# Patient Record
Sex: Male | Born: 1975 | Race: Black or African American | Hispanic: No | Marital: Single | Smoking: Current every day smoker
Health system: Southern US, Community
[De-identification: ages and names within clinical notes are randomized; demographics above are authoritative.]

---

## 2002-02-19 ENCOUNTER — Emergency Department (HOSPITAL_COMMUNITY): Admission: EM | Admit: 2002-02-19 | Discharge: 2002-02-19 | Payer: Self-pay | Admitting: *Deleted

## 2003-10-03 ENCOUNTER — Emergency Department (HOSPITAL_COMMUNITY): Admission: EM | Admit: 2003-10-03 | Discharge: 2003-10-03 | Payer: Self-pay | Admitting: Emergency Medicine

## 2004-01-18 ENCOUNTER — Emergency Department (HOSPITAL_COMMUNITY): Admission: EM | Admit: 2004-01-18 | Discharge: 2004-01-18 | Payer: Self-pay | Admitting: Emergency Medicine

## 2004-04-18 ENCOUNTER — Emergency Department (HOSPITAL_COMMUNITY): Admission: EM | Admit: 2004-04-18 | Discharge: 2004-04-18 | Payer: Self-pay | Admitting: Emergency Medicine

## 2004-09-07 ENCOUNTER — Emergency Department (HOSPITAL_COMMUNITY): Admission: EM | Admit: 2004-09-07 | Discharge: 2004-09-07 | Payer: Self-pay | Admitting: Emergency Medicine

## 2004-09-10 ENCOUNTER — Emergency Department (HOSPITAL_COMMUNITY): Admission: EM | Admit: 2004-09-10 | Discharge: 2004-09-10 | Payer: Self-pay | Admitting: Emergency Medicine

## 2005-12-11 ENCOUNTER — Emergency Department (HOSPITAL_COMMUNITY): Admission: EM | Admit: 2005-12-11 | Discharge: 2005-12-11 | Payer: Self-pay | Admitting: Emergency Medicine

## 2006-04-29 ENCOUNTER — Emergency Department (HOSPITAL_COMMUNITY): Admission: EM | Admit: 2006-04-29 | Discharge: 2006-04-29 | Payer: Self-pay | Admitting: Emergency Medicine

## 2006-09-30 ENCOUNTER — Emergency Department (HOSPITAL_COMMUNITY): Admission: EM | Admit: 2006-09-30 | Discharge: 2006-09-30 | Payer: Self-pay | Admitting: Emergency Medicine

## 2006-12-13 ENCOUNTER — Emergency Department (HOSPITAL_COMMUNITY): Admission: EM | Admit: 2006-12-13 | Discharge: 2006-12-13 | Payer: Self-pay | Admitting: Emergency Medicine

## 2007-06-21 ENCOUNTER — Emergency Department (HOSPITAL_COMMUNITY): Admission: EM | Admit: 2007-06-21 | Discharge: 2007-06-21 | Payer: Self-pay | Admitting: Emergency Medicine

## 2007-09-20 ENCOUNTER — Emergency Department (HOSPITAL_COMMUNITY): Admission: EM | Admit: 2007-09-20 | Discharge: 2007-09-20 | Payer: Self-pay | Admitting: Emergency Medicine

## 2008-08-05 ENCOUNTER — Emergency Department (HOSPITAL_COMMUNITY): Admission: EM | Admit: 2008-08-05 | Discharge: 2008-08-06 | Payer: Self-pay | Admitting: Emergency Medicine

## 2010-04-06 ENCOUNTER — Emergency Department (HOSPITAL_COMMUNITY): Admission: EM | Admit: 2010-04-06 | Discharge: 2010-04-06 | Payer: Self-pay | Admitting: Emergency Medicine

## 2010-11-27 ENCOUNTER — Emergency Department (HOSPITAL_COMMUNITY)
Admission: EM | Admit: 2010-11-27 | Discharge: 2010-11-27 | Disposition: A | Discharge status: Home / Self Care | Payer: Self-pay | Attending: Emergency Medicine | Admitting: Emergency Medicine

## 2010-11-27 DIAGNOSIS — L989 Disorder of the skin and subcutaneous tissue, unspecified: Secondary | ICD-10-CM | POA: Insufficient documentation

## 2010-11-27 DIAGNOSIS — J3489 Other specified disorders of nose and nasal sinuses: Secondary | ICD-10-CM | POA: Insufficient documentation

## 2010-11-27 DIAGNOSIS — R509 Fever, unspecified: Secondary | ICD-10-CM | POA: Insufficient documentation

## 2010-11-27 DIAGNOSIS — B9789 Other viral agents as the cause of diseases classified elsewhere: Secondary | ICD-10-CM | POA: Insufficient documentation

## 2010-11-27 DIAGNOSIS — IMO0001 Reserved for inherently not codable concepts without codable children: Secondary | ICD-10-CM | POA: Insufficient documentation

## 2010-11-27 DIAGNOSIS — R6889 Other general symptoms and signs: Secondary | ICD-10-CM | POA: Insufficient documentation

## 2010-11-27 DIAGNOSIS — R5381 Other malaise: Secondary | ICD-10-CM | POA: Insufficient documentation

## 2010-11-27 DIAGNOSIS — J45909 Unspecified asthma, uncomplicated: Secondary | ICD-10-CM | POA: Insufficient documentation

## 2011-04-02 ENCOUNTER — Emergency Department (HOSPITAL_COMMUNITY)
Admission: EM | Admit: 2011-04-02 | Discharge: 2011-04-02 | Disposition: A | Discharge status: Home / Self Care | Payer: Self-pay | Attending: Emergency Medicine | Admitting: Emergency Medicine

## 2011-04-02 DIAGNOSIS — R0602 Shortness of breath: Secondary | ICD-10-CM | POA: Insufficient documentation

## 2011-04-02 DIAGNOSIS — J45909 Unspecified asthma, uncomplicated: Secondary | ICD-10-CM | POA: Insufficient documentation

## 2011-04-02 DIAGNOSIS — F1010 Alcohol abuse, uncomplicated: Secondary | ICD-10-CM | POA: Insufficient documentation

## 2011-04-02 DIAGNOSIS — R112 Nausea with vomiting, unspecified: Secondary | ICD-10-CM | POA: Insufficient documentation

## 2011-07-16 LAB — RAPID STREP SCREEN (MED CTR MEBANE ONLY): Streptococcus, Group A Screen (Direct): NEGATIVE

## 2012-01-31 ENCOUNTER — Emergency Department (HOSPITAL_COMMUNITY)
Admission: EM | Admit: 2012-01-31 | Discharge: 2012-01-31 | Disposition: A | Discharge status: Home / Self Care | Payer: Self-pay | Attending: Emergency Medicine | Admitting: Emergency Medicine

## 2012-01-31 ENCOUNTER — Encounter (HOSPITAL_COMMUNITY): Payer: Self-pay | Admitting: Emergency Medicine

## 2012-01-31 DIAGNOSIS — H6690 Otitis media, unspecified, unspecified ear: Secondary | ICD-10-CM | POA: Insufficient documentation

## 2012-01-31 DIAGNOSIS — H9209 Otalgia, unspecified ear: Secondary | ICD-10-CM | POA: Insufficient documentation

## 2012-01-31 DIAGNOSIS — H9201 Otalgia, right ear: Secondary | ICD-10-CM

## 2012-01-31 MED ORDER — AMOXICILLIN 500 MG PO CAPS: 500.0000 mg | ORAL_CAPSULE | Freq: Three times a day (TID) | ORAL | Status: AC

## 2012-01-31 NOTE — Discharge Instructions (Signed)
RESOURCE GUIDE  Dental Problems  Patients with Medicaid: [facility]                     [facility] [address]                                           [address] Phone:  [phone]                                                  Phone:  [phone]  If unable to pay or uninsured, contact:  [facility] or [facility] to become qualified for the adult dental clinic.  Chronic Pain Problems Contact [facility]  [phone] Patients need to be referred by their primary care doctor.  Insufficient Money for Medicine Contact [employer]:  call "211" or [facility] 271-5999.  No Primary Care Doctor Call [facility]  832-8000 Other agencies that provide inexpensive medical care    [facility]  [phone]    [facility]  [phone]    [facility]  [phone]    [facility]  [phone]    [facility]  [phone]    [facility]  [phone]  Psychological Services [facility]  [phone] [facility]  [phone] [facility]   800 853-5163 (emergency services 641-4993)  Substance Abuse Resources [facility]  336-882-2125 [facility] 336-784-9470 [facility] 336-285-9073 [facility] 336-845-3988 Residential & Outpatient Substance Abuse Program  800-659-3381  Abuse/Neglect [place] Child Abuse Hotline (336) 641-3795 [place] Child Abuse Hotline 800-378-5315 (After Hours)  Emergency Shelter [facility] (336) 271-5985  Maternity Homes [facility] (336) 275-9566 [facility] (704) 372-4663  MRSA Hotline #:   832-7006    [place] [facility]  [facility] [place] [facility]     [employer]                          [facility] [address] [place]                       [address]      [address]  [place]                                                [place]                            [place] Phone:  [phone]                                   Phone:  [phone]                 Phone:  [phone]  [facility] Phone:  [phone]    [facility] Child Abuse Hotline 410-673-0136 857-164-4084 (After Hours)   Take the prescription as directed.  Take over the counter sudafed, as directed on packaging, for the next week.  Use over the counter normal saline nasal spray, as instructed in the Emergency Department, several times per day for the next 2 weeks.  Call your regular medical doctor on Monday to schedule a follow up appointment within the next week.  Return to the Emergency Department immediately if worsening.

## 2012-01-31 NOTE — ED Provider Notes (Signed)
History     CSN: [id number]  Arrival date & time 01/31/12  1549   First MD Initiated Contact with Patient 01/31/12 1703      Chief Complaint  Patient presents with  . Otalgia    HPI Pt was seen at 1715.  Per pt, c/o gradual onset and persistence of constant right ear "pain" for the past 3 days.  Pt states he recently had URI symptoms, but they have resolved.  Denies fevers, no rash, no sore throat, no ear injury, no cough/SOB.      Past Medical History  Diagnosis Date  . Asthma     History reviewed. No pertinent past surgical history.   History  Substance Use Topics  . Smoking status: Former Smoker    Quit date: 01/17/2012  . Smokeless tobacco: Not on file  . Alcohol Use: No    Review of Systems ROS: Statement: All systems negative except as marked or noted in the HPI; Constitutional: Negative for fever and chills. ; ; Eyes: Negative for eye pain, redness and discharge. ; ; ENMT: +right ear pain. Negative for hoarseness, nasal congestion, sinus pressure and sore throat. ; ; Cardiovascular: Negative for chest pain, palpitations, diaphoresis, dyspnea and peripheral edema. ; ; Respiratory: Negative for cough, wheezing and stridor. ; ; Gastrointestinal: Negative for nausea, vomiting, diarrhea, abdominal pain, blood in stool, hematemesis, jaundice and rectal bleeding. . ; ; Genitourinary: Negative for dysuria, flank pain and hematuria. ; ; Musculoskeletal: Negative for back pain and neck pain. Negative for swelling and trauma.; ; Skin: Negative for pruritus, rash, abrasions, blisters, bruising and skin lesion.; ; Neuro: Negative for headache, lightheadedness and neck stiffness. Negative for weakness, altered level of consciousness , altered mental status, extremity weakness, paresthesias, involuntary movement, seizure and syncope.     Allergies  Aspirin  Home Medications   Current Outpatient Rx  Name Route Sig Dispense Refill  . VICKS DAYQUIL COUGH PO Oral Take 1-2 capsules by  mouth daily as needed. For cold symptoms    . SUDAFED PO Oral Take 1 tablet by mouth 4 (four) times daily as needed. For cold symptoms      BP 160/97  Pulse 101  Temp(Src) 98.6 F (37 C) (Oral)  Resp 16  SpO2 100%  Physical Exam 1720: Physical examination:  Nursing notes reviewed; Vital signs and O2 SAT reviewed;  Constitutional: Well developed, Well nourished, Well hydrated, In no acute distress; Head:  Normocephalic, atraumatic; Eyes: EOMI, PERRL, No scleral icterus; ENMT: +purulent fluid behind right TM. No FB in canal.  Left TM clear.  +edemetous nasal turbinates bilat with clear rhinorrhea. Mouth and pharynx normal, Mucous membranes moist; Neck: Supple, Full range of motion, No lymphadenopathy; Cardiovascular: Regular rate and rhythm, No murmur or gallop; Respiratory: Breath sounds clear & equal bilaterally, No rales, rhonchi, wheezes, or rub, Normal respiratory effort/excursion; Chest: Nontender, Movement normal; Extremities: Pulses normal, No tenderness, No edema, No calf edema or asymmetry.; Neuro: AA&Ox3, Major CN grossly intact.  No gross focal motor or sensory deficits in extremities.; Skin: Color normal, Warm, Dry, no rash.    ED Course  Procedures   MDM  MDM Reviewed: nursing note and vitals    1730:  Recent URI since improved but now worsening right ear pain.  Appears AOM, will tx.            Laray Anger, DO 01/31/12 2257

## 2012-01-31 NOTE — ED Notes (Addendum)
C/o R earache x 3 days.  States he has recently had cold symptoms and several ticks on him. Unable to hear out of R ear today.

## 2012-07-02 ENCOUNTER — Encounter (HOSPITAL_COMMUNITY): Payer: Self-pay | Admitting: *Deleted

## 2012-07-02 ENCOUNTER — Emergency Department (HOSPITAL_COMMUNITY)
Admission: EM | Admit: 2012-07-02 | Discharge: 2012-07-02 | Disposition: A | Discharge status: Home / Self Care | Payer: Self-pay | Attending: Emergency Medicine | Admitting: Emergency Medicine

## 2012-07-02 DIAGNOSIS — R05 Cough: Secondary | ICD-10-CM | POA: Insufficient documentation

## 2012-07-02 DIAGNOSIS — R51 Headache: Secondary | ICD-10-CM | POA: Insufficient documentation

## 2012-07-02 DIAGNOSIS — H9209 Otalgia, unspecified ear: Secondary | ICD-10-CM | POA: Insufficient documentation

## 2012-07-02 DIAGNOSIS — R059 Cough, unspecified: Secondary | ICD-10-CM | POA: Insufficient documentation

## 2012-07-02 DIAGNOSIS — J45909 Unspecified asthma, uncomplicated: Secondary | ICD-10-CM | POA: Insufficient documentation

## 2012-07-02 DIAGNOSIS — J029 Acute pharyngitis, unspecified: Secondary | ICD-10-CM | POA: Insufficient documentation

## 2012-07-02 DIAGNOSIS — R599 Enlarged lymph nodes, unspecified: Secondary | ICD-10-CM | POA: Insufficient documentation

## 2012-07-02 DIAGNOSIS — R509 Fever, unspecified: Secondary | ICD-10-CM | POA: Insufficient documentation

## 2012-07-02 LAB — RAPID STREP SCREEN (MED CTR MEBANE ONLY): Streptococcus, Group A Screen (Direct): NEGATIVE

## 2012-07-02 MED ORDER — OXYCODONE-ACETAMINOPHEN 5-325 MG PO TABS: 1.0000 | ORAL_TABLET | Freq: Four times a day (QID) | ORAL | Status: DC | PRN

## 2012-07-02 MED ORDER — PENICILLIN V POTASSIUM 500 MG PO TABS: 500.0000 mg | ORAL_TABLET | Freq: Four times a day (QID) | ORAL | Status: AC

## 2012-07-02 NOTE — ED Notes (Signed)
The pt has had respiratory problems for 3 days.  Lt earache sorethroat headache  Non-productive cough

## 2012-07-02 NOTE — ED Provider Notes (Signed)
History  Scribed for [employer]. Clara Smolen, MD, the patient was seen in room TR10C/TR10C. This chart was scribed by Candelaria Stagers. The patient's care started at 5:19 PM   CSN: [id number]  Arrival date & time 07/02/12  1627   First MD Initiated Contact with Patient 07/02/12 1704      Chief Complaint  Patient presents with  . upper resp symptoms      The history is provided by the patient. No language interpreter was used.   Anthony Patterson is a 36 y.o. male who presents to the Emergency Department complaining of sore throat, left earache, and non productive cough that started three days ago.  Pt has also experienced headache and fever.  Nothing seems to make the sx better or worse.  Pt smokes.      Past Medical History  Diagnosis Date  . Asthma     History reviewed. No pertinent past surgical history.  No family history on file.  History  Substance Use Topics  . Smoking status: Former Smoker    Quit date: 01/17/2012  . Smokeless tobacco: Not on file  . Alcohol Use: No      Review of Systems  Constitutional: Positive for fever.  HENT: Positive for ear pain (left ear) and sore throat.   Respiratory: Positive for cough.   Gastrointestinal: Negative for nausea, vomiting and anal bleeding.  Neurological: Positive for headaches.  All other systems reviewed and are negative.    Allergies  Aspirin  Home Medications   Current Outpatient Rx  Name Route Sig Dispense Refill  . TYLENOL PO Oral Take 1 tablet by mouth once.    Marland Kitchen PHENYLEPHRINE HCL 10 MG PO TABS Oral Take 10 mg by mouth every 4 (four) hours as needed. For nasal congestion    . DAYQUIL PO Oral Take 10 mLs by mouth once.    . OXYCODONE-ACETAMINOPHEN 5-325 MG PO TABS Oral Take 1-2 tablets by mouth every 6 (six) hours as needed for pain. 10 tablet 0  . PENICILLIN V POTASSIUM 500 MG PO TABS Oral Take 1 tablet (500 mg total) by mouth 4 (four) times daily. 40 tablet 0    BP 141/74  Pulse 75  Temp 98 F (36.7  C) (Oral)  Resp 18  SpO2 97%  Physical Exam  Nursing note and vitals reviewed. Constitutional: He is oriented to person, place, and time. He appears well-developed and well-nourished. No distress.  HENT:  Head: Normocephalic and atraumatic.       Mild erythema to left TM.  Left postier pharynx mild erythema.  No peritonsilar swelling  Eyes: EOM are normal. Pupils are equal, round, and reactive to light.  Neck: Neck supple. No tracheal deviation present.  Pulmonary/Chest: Effort normal. No respiratory distress.  Musculoskeletal: Normal range of motion. He exhibits no edema.  Lymphadenopathy:    He has cervical adenopathy.  Neurological: He is alert and oriented to person, place, and time.  Skin: Skin is warm and dry.  Psychiatric: He has a normal mood and affect. His behavior is normal.    ED Course  Procedures   DIAGNOSTIC STUDIES: Oxygen Saturation is 97% on room air, normal by my interpretation.    COORDINATION OF CARE:  17:16 Ordered: Rapid strep screen     Labs Reviewed  RAPID STREP SCREEN   No results found.   1. Pharyngitis       MDM  Patient with sore throat left-sided earache. Nonproductive cough. He has some exudate to be treated  as possible streptococcal pharyngitis. He'll be given antibiotics and pain medications and discharged home. No signs of abscesses.   I personally performed the services described in this documentation, which was scribed in my presence. The recorded information has been reviewed and considered.        Juliet Rude. Rubin Payor, MD 07/02/12 1729

## 2012-08-07 ENCOUNTER — Encounter (HOSPITAL_COMMUNITY): Payer: Self-pay | Admitting: Emergency Medicine

## 2012-08-07 ENCOUNTER — Emergency Department (HOSPITAL_COMMUNITY)
Admission: EM | Admit: 2012-08-07 | Discharge: 2012-08-07 | Disposition: A | Discharge status: Home / Self Care | Payer: No Typology Code available for payment source | Attending: Emergency Medicine | Admitting: Emergency Medicine

## 2012-08-07 ENCOUNTER — Emergency Department (HOSPITAL_COMMUNITY): Payer: No Typology Code available for payment source

## 2012-08-07 DIAGNOSIS — R51 Headache: Secondary | ICD-10-CM | POA: Insufficient documentation

## 2012-08-07 DIAGNOSIS — Y9389 Activity, other specified: Secondary | ICD-10-CM | POA: Insufficient documentation

## 2012-08-07 DIAGNOSIS — Z87891 Personal history of nicotine dependence: Secondary | ICD-10-CM | POA: Insufficient documentation

## 2012-08-07 DIAGNOSIS — S39012A Strain of muscle, fascia and tendon of lower back, initial encounter: Secondary | ICD-10-CM

## 2012-08-07 DIAGNOSIS — S335XXA Sprain of ligaments of lumbar spine, initial encounter: Secondary | ICD-10-CM | POA: Insufficient documentation

## 2012-08-07 DIAGNOSIS — S161XXA Strain of muscle, fascia and tendon at neck level, initial encounter: Secondary | ICD-10-CM

## 2012-08-07 DIAGNOSIS — S139XXA Sprain of joints and ligaments of unspecified parts of neck, initial encounter: Secondary | ICD-10-CM | POA: Insufficient documentation

## 2012-08-07 DIAGNOSIS — J45909 Unspecified asthma, uncomplicated: Secondary | ICD-10-CM | POA: Insufficient documentation

## 2012-08-07 DIAGNOSIS — Y9289 Other specified places as the place of occurrence of the external cause: Secondary | ICD-10-CM | POA: Insufficient documentation

## 2012-08-07 MED ORDER — CYCLOBENZAPRINE HCL 10 MG PO TABS: 10.0000 mg | ORAL_TABLET | Freq: Three times a day (TID) | ORAL | Status: DC | PRN

## 2012-08-07 MED ORDER — OXYCODONE-ACETAMINOPHEN 5-325 MG PO TABS: 1.0000 | ORAL_TABLET | Freq: Four times a day (QID) | ORAL | Status: DC | PRN

## 2012-08-07 NOTE — ED Notes (Signed)
Patient transported to and from X-ray 
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
## 2012-08-07 NOTE — ED Notes (Signed)
Patient transported to X-ray 
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
## 2012-09-21 ENCOUNTER — Encounter (HOSPITAL_COMMUNITY): Payer: Self-pay | Admitting: Emergency Medicine

## 2012-09-21 ENCOUNTER — Emergency Department (HOSPITAL_COMMUNITY): Payer: Self-pay

## 2012-09-21 ENCOUNTER — Emergency Department (HOSPITAL_COMMUNITY)
Admission: EM | Admit: 2012-09-21 | Discharge: 2012-09-21 | Disposition: A | Discharge status: Home / Self Care | Payer: Self-pay | Attending: Emergency Medicine | Admitting: Emergency Medicine

## 2012-09-21 DIAGNOSIS — W230XXA Caught, crushed, jammed, or pinched between moving objects, initial encounter: Secondary | ICD-10-CM | POA: Insufficient documentation

## 2012-09-21 DIAGNOSIS — J45909 Unspecified asthma, uncomplicated: Secondary | ICD-10-CM | POA: Insufficient documentation

## 2012-09-21 DIAGNOSIS — Y9389 Activity, other specified: Secondary | ICD-10-CM | POA: Insufficient documentation

## 2012-09-21 DIAGNOSIS — Z87891 Personal history of nicotine dependence: Secondary | ICD-10-CM | POA: Insufficient documentation

## 2012-09-21 DIAGNOSIS — T148XXA Other injury of unspecified body region, initial encounter: Secondary | ICD-10-CM

## 2012-09-21 DIAGNOSIS — S8000XA Contusion of unspecified knee, initial encounter: Secondary | ICD-10-CM | POA: Insufficient documentation

## 2012-09-21 DIAGNOSIS — Y929 Unspecified place or not applicable: Secondary | ICD-10-CM | POA: Insufficient documentation

## 2012-09-21 DIAGNOSIS — S8990XA Unspecified injury of unspecified lower leg, initial encounter: Secondary | ICD-10-CM | POA: Insufficient documentation

## 2012-09-21 MED ORDER — IBUPROFEN 400 MG PO TABS
600.0000 mg | ORAL_TABLET | Freq: Once | ORAL | Status: AC
  Administered 2012-09-21: 600 mg via ORAL
  Filled 2012-09-21: qty 1

## 2012-09-21 MED ORDER — IBUPROFEN 600 MG PO TABS: 600.0000 mg | ORAL_TABLET | Freq: Four times a day (QID) | ORAL | Status: DC | PRN

## 2012-09-21 MED ORDER — HYDROCODONE-ACETAMINOPHEN 5-325 MG PO TABS: 1.0000 | ORAL_TABLET | Freq: Four times a day (QID) | ORAL | Status: DC | PRN

## 2012-09-21 MED ORDER — HYDROCODONE-ACETAMINOPHEN 5-325 MG PO TABS
2.0000 | ORAL_TABLET | Freq: Once | ORAL | Status: AC
  Administered 2012-09-21: 2 via ORAL
  Filled 2012-09-21: qty 2
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
## 2012-09-21 NOTE — ED Notes (Signed)
Ortho at bedside.

## 2012-09-21 NOTE — Progress Notes (Signed)
Orthopedic Tech Progress Note Patient Details:  Elimelech Houseman [facility] 1976/08/31 [id number]  Ortho Devices Type of Ortho Device: Knee Immobilizer Ortho Device/Splint Location: (L) LE Ortho Device/Splint Interventions: Application   Jennye Moccasin 09/21/2012, 8:17 PM

## 2012-09-21 NOTE — ED Notes (Signed)
Pt transported to XR.  
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
## 2012-10-12 ENCOUNTER — Emergency Department (HOSPITAL_COMMUNITY): Payer: No Typology Code available for payment source

## 2012-10-12 ENCOUNTER — Encounter (HOSPITAL_COMMUNITY): Payer: Self-pay | Admitting: Cardiology

## 2012-10-12 ENCOUNTER — Emergency Department (HOSPITAL_COMMUNITY)
Admission: EM | Admit: 2012-10-12 | Discharge: 2012-10-12 | Disposition: A | Discharge status: Home / Self Care | Payer: No Typology Code available for payment source | Attending: Emergency Medicine | Admitting: Emergency Medicine

## 2012-10-12 DIAGNOSIS — Z79899 Other long term (current) drug therapy: Secondary | ICD-10-CM | POA: Insufficient documentation

## 2012-10-12 DIAGNOSIS — R079 Chest pain, unspecified: Secondary | ICD-10-CM | POA: Insufficient documentation

## 2012-10-12 DIAGNOSIS — I10 Essential (primary) hypertension: Secondary | ICD-10-CM | POA: Insufficient documentation

## 2012-10-12 DIAGNOSIS — M79609 Pain in unspecified limb: Secondary | ICD-10-CM | POA: Insufficient documentation

## 2012-10-12 DIAGNOSIS — R0602 Shortness of breath: Secondary | ICD-10-CM | POA: Insufficient documentation

## 2012-10-12 DIAGNOSIS — J45909 Unspecified asthma, uncomplicated: Secondary | ICD-10-CM | POA: Insufficient documentation

## 2012-10-12 DIAGNOSIS — F17200 Nicotine dependence, unspecified, uncomplicated: Secondary | ICD-10-CM | POA: Insufficient documentation

## 2012-10-12 LAB — POCT I-STAT, CHEM 8
Creatinine, Ser: 0.8 mg/dL (ref 0.50–1.35)
Hemoglobin: 15 g/dL (ref 13.0–17.0)
Sodium: 140 mEq/L (ref 135–145)
TCO2: 28 mmol/L (ref 0–100)

## 2012-10-12 NOTE — ED Notes (Signed)
Pt here today because for the past 2-3 days, he has had throbbing in his head and left arm tingling. Went to [employer] to check his BP, got anxious and had a short episode of chest "tightness". No CP at this time.
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
## 2012-10-12 NOTE — ED Notes (Signed)
Pt ambulated to restroom. 

## 2012-10-12 NOTE — ED Notes (Signed)
Pt states that for the last several weeks he has had intermittent L leg and L arm tingling/pain.  He also describes a severe HA in his bilateral temple area.  Denies Nausea or vomiting.  Skin warm and dry.  Resp symmetrical and unlabored.  States that he does not have Chest pain at the present.

## 2012-10-12 NOTE — ED Notes (Signed)
Report given to [place]. Pt moved to Pod C

## 2012-10-12 NOTE — ED Notes (Signed)
Pt given discharge paperwork with a list of resources; pt verbalized understanding of discharge; pt alert and oriented x4;

## 2012-10-12 NOTE — ED Notes (Signed)
Pt to XRAY
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
## 2012-10-13 NOTE — ED Provider Notes (Signed)
Medical screening examination/treatment/procedure(s) were performed by non-physician practitioner and as supervising physician I was immediately available for consultation/collaboration.   Gwyneth Sprout, MD 10/13/12 [phone]

## 2013-06-09 ENCOUNTER — Encounter (HOSPITAL_COMMUNITY): Payer: Self-pay | Admitting: *Deleted

## 2013-06-09 ENCOUNTER — Emergency Department (HOSPITAL_COMMUNITY)
Admission: EM | Admit: 2013-06-09 | Discharge: 2013-06-09 | Disposition: A | Discharge status: Home / Self Care | Payer: Self-pay | Attending: Emergency Medicine | Admitting: Emergency Medicine

## 2013-06-09 ENCOUNTER — Emergency Department (HOSPITAL_COMMUNITY): Payer: Self-pay

## 2013-06-09 DIAGNOSIS — J3489 Other specified disorders of nose and nasal sinuses: Secondary | ICD-10-CM | POA: Insufficient documentation

## 2013-06-09 DIAGNOSIS — H9209 Otalgia, unspecified ear: Secondary | ICD-10-CM | POA: Insufficient documentation

## 2013-06-09 DIAGNOSIS — F17200 Nicotine dependence, unspecified, uncomplicated: Secondary | ICD-10-CM | POA: Insufficient documentation

## 2013-06-09 DIAGNOSIS — J020 Streptococcal pharyngitis: Secondary | ICD-10-CM | POA: Insufficient documentation

## 2013-06-09 DIAGNOSIS — R6883 Chills (without fever): Secondary | ICD-10-CM | POA: Insufficient documentation

## 2013-06-09 DIAGNOSIS — IMO0001 Reserved for inherently not codable concepts without codable children: Secondary | ICD-10-CM | POA: Insufficient documentation

## 2013-06-09 DIAGNOSIS — J45901 Unspecified asthma with (acute) exacerbation: Secondary | ICD-10-CM | POA: Insufficient documentation

## 2013-06-09 MED ORDER — PENICILLIN G BENZATHINE 1200000 UNIT/2ML IM SUSP
1.2000 10*6.[IU] | Freq: Once | INTRAMUSCULAR | Status: AC
  Administered 2013-06-09: 1.2 10*6.[IU] via INTRAMUSCULAR
  Filled 2013-06-09: qty 2

## 2013-06-09 MED ORDER — DEXAMETHASONE 6 MG PO TABS
10.0000 mg | ORAL_TABLET | Freq: Once | ORAL | Status: AC
  Administered 2013-06-09: 10 mg via ORAL
  Filled 2013-06-09: qty 1

## 2013-06-09 NOTE — ED Notes (Signed)
Pt in c/o bilateral earache, sore throat, and congestion, since Friday evening, pt has taken leftover antibiotics at home with no relief

## 2013-06-09 NOTE — ED Provider Notes (Signed)
CSN: [id number]     Arrival date & time 06/09/13  1639 History   First MD Initiated Contact with Patient 06/09/13 1739     Chief Complaint  Patient presents with  . Otalgia  . Sore Throat   (Consider location/radiation/quality/duration/timing/severity/associated sxs/prior Treatment) HPI This is a 37 year old man who presents with bilateral otalgia, sore throat, and congestion. Patient states he had onset of symptoms on Friday. It started in his right ear. He's been taking 800 mg of ibuprofen without any relief. Patient states that he woke up on Saturday morning and felt like he had "glass in my throat."  Patient denies any difficulty swallowing or decreased by mouth intake. Patient states that he took one of his friend's penicillin tablets 2 days ago hoping it would help. Patient denies any cough. He has had subjective chills at home but no documented temperature.  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 Patient denies any chest pain but does endorse dyspnea on exertion. He states that he just felt more tired and short of breath when working with this patients. Past Medical History  Diagnosis Date  . Asthma    History reviewed. No pertinent past surgical history. History reviewed. No pertinent family history. History  Substance Use Topics  . Smoking status: Current Every Day Smoker -- 0.25 packs/day    Types: Cigarettes  . Smokeless tobacco: Not on file  . Alcohol Use: No    Review of Systems  Constitutional: Positive for chills. Negative for fever.  HENT: Positive for ear pain and sore throat. Negative for neck stiffness.   Respiratory: Positive for shortness of breath. Negative for chest tightness.   Cardiovascular: Negative.  Negative for chest pain.  Gastrointestinal: Negative.  Negative for abdominal pain.  Genitourinary: Negative.  Negative for dysuria.  Musculoskeletal: Positive for myalgias.  Skin: Negative for rash.  Neurological: Negative for headaches.  All other  systems reviewed and are negative.    Allergies  Aspirin  Home Medications   Current Outpatient Rx  Name  Route  Sig  Dispense  Refill  . ibuprofen (ADVIL,MOTRIN) 800 MG tablet   Oral   Take 800 mg by mouth every 8 (eight) hours as needed for pain.         Marland Kitchen penicillin v potassium (VEETID) 500 MG tablet   Oral   Take 500 mg by mouth once.          BP 141/76  Pulse 89  Temp(Src) 99.3 F (37.4 C) (Oral)  Resp 16  SpO2 99% Physical Exam  Nursing note and vitals reviewed. Constitutional: He is oriented to person, place, and time. He appears well-developed and well-nourished. No distress.  HENT:  Head: Normocephalic and atraumatic.  Mouth/Throat: Oropharyngeal exudate present.  Bilateral TMs though no obvious effusion or errythema noted.  Bilateral tonsillar enlargement with exudate noted  Eyes: Conjunctivae are normal. Pupils are equal, round, and reactive to light. Right eye exhibits no discharge.  Neck: Neck supple.  Cardiovascular: Normal rate, regular rhythm and normal heart sounds.   No murmur heard. Pulmonary/Chest: Effort normal and breath sounds normal. No respiratory distress. He has no wheezes.  Abdominal: Soft. Bowel sounds are normal. There is no tenderness. There is no rebound.  Musculoskeletal: He exhibits no edema.  Lymphadenopathy:    He has cervical adenopathy.  Neurological: He is alert and oriented to person, place, and time.  Skin: Skin is warm and dry.  Psychiatric: He has a normal mood and affect.  ED Course  Procedures (including critical care time) Labs Review Labs Reviewed - No data to display Imaging Review Dg Chest 2 View  06/09/2013   *RADIOLOGY REPORT*  Clinical Data:  Fever, shortness of breath and cough.  CHEST - 2 VIEW  Comparison: 10/12/2012  Findings: The heart size and mediastinal contours are within normal limits.  Both lungs are clear.  The visualized skeletal structures are unremarkable.  IMPRESSION: No active disease.    Original Report Authenticated By: Irish Lack, M.D.    MDM   1. Strep pharyngitis    Patient presents with otalgia and bilateral ear pain. Nontoxic-appearing on exam. Patient has subjective fevers, no cough, anterior adenopathy, and exudate on the tonsils. Given his physical exam findings, he was given IM Bicillin and Decadron. Chest x-ray was obtained given his reports of shortness of breath. This is negative. Patient will be discharged home.  After history, exam, and medical workup I feel the patient has been appropriately medically screened and is safe for discharge home. Pertinent diagnoses were discussed with the patient. Patient was given return precautions.   Shon Baton, MD 06/10/13 [phone]

## 2013-07-19 IMAGING — CT CT HEAD W/O CM
1 series · 16 of 30 positions shown, 20 images · non-contrast
Comparison: None.

CLINICAL DATA: 36-year-old male with headache.  Elevated blood
pressure.  Left side tingling.

CT HEAD WITHOUT CONTRAST
TECHNIQUE: Contiguous axial images were obtained from the base of
the skull through the vertex without contrast.

[Series 2: head routine 4.8 h37s · axial · 0.43mm/px · z∈[-56,+99]mm · 16 of 36 slices shown, 20 images]
[im 2/36  brain]
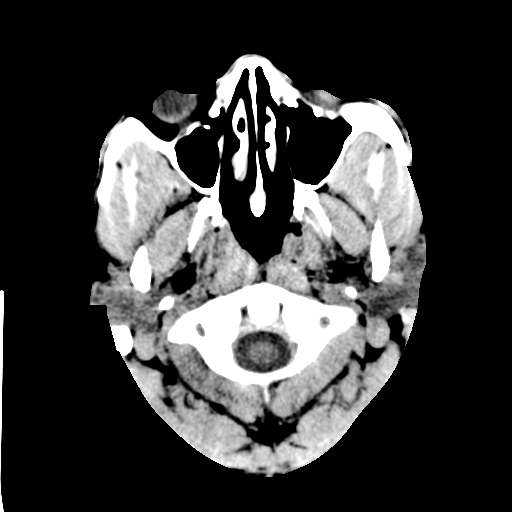
[im 2/36  bone]
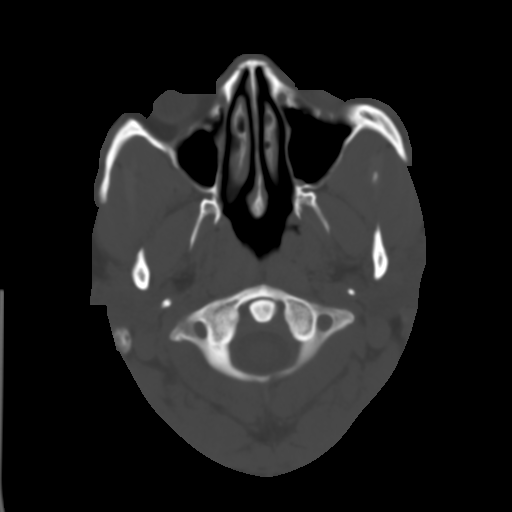
[im 4/36  brain]
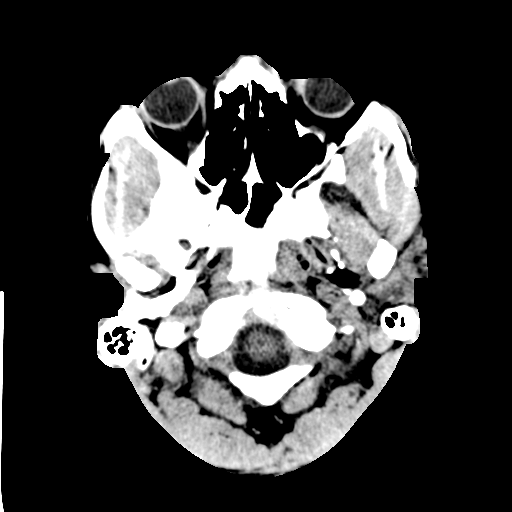
[im 7/36  brain]
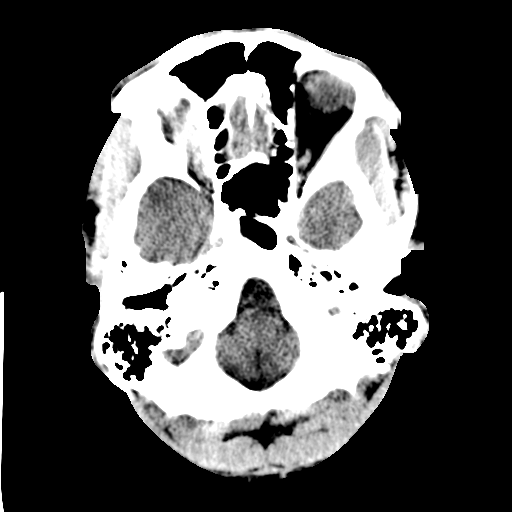
[im 9/36  brain]
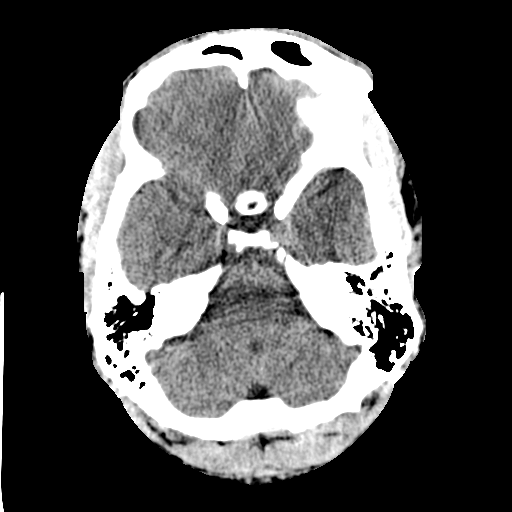
[im 10/36  brain]
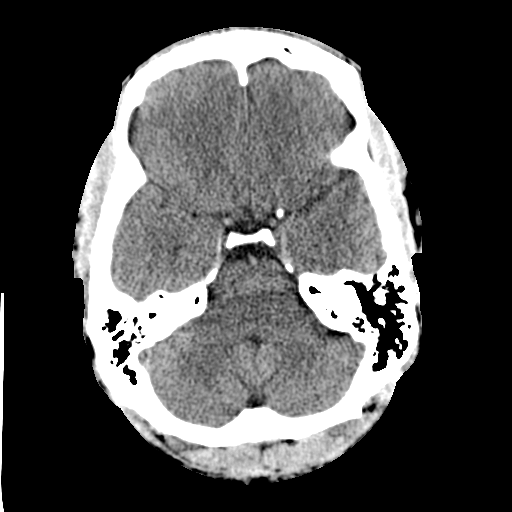
[im 10/36  bone]
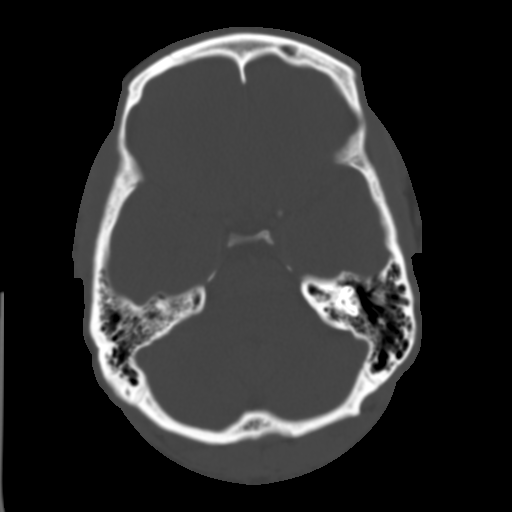
[im 13/36  brain]
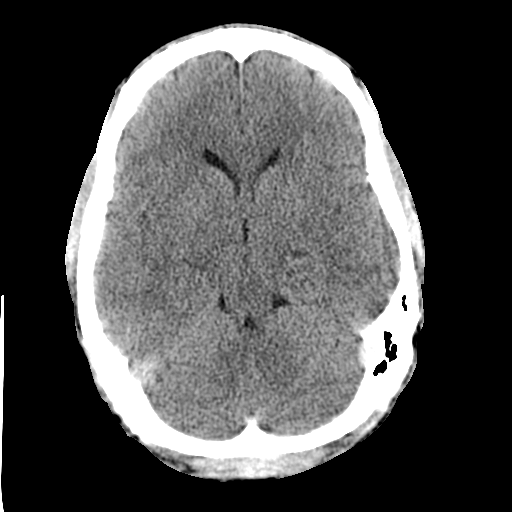
[im 15/36  brain]
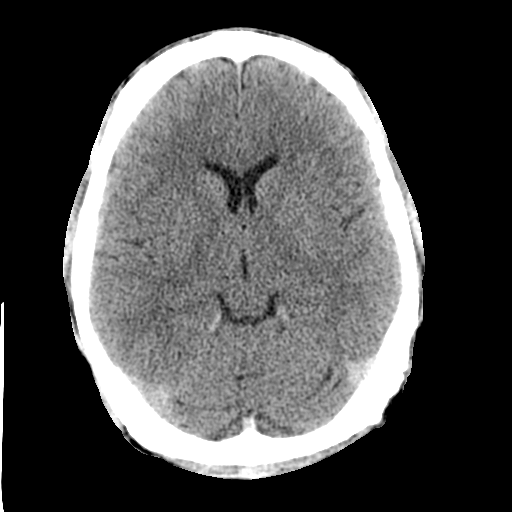
[im 17/36  brain]
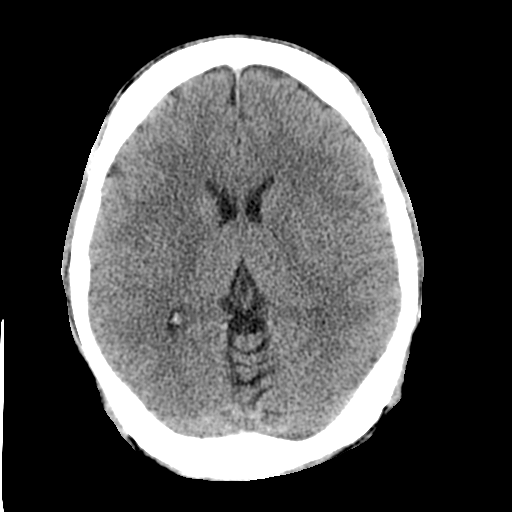
[im 19/36  brain]
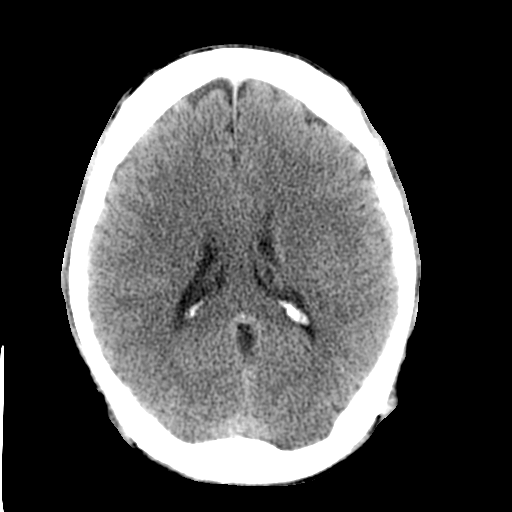
[im 19/36  bone]
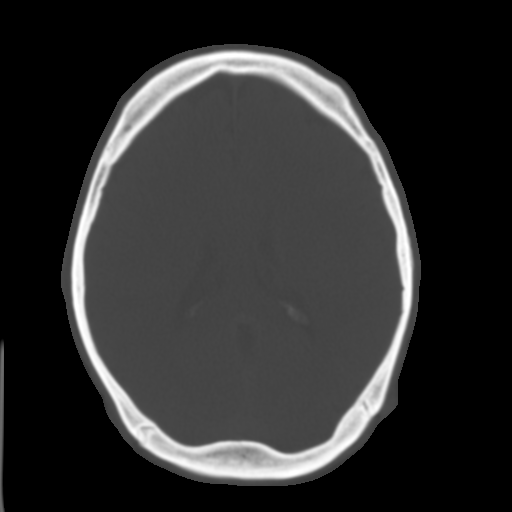
[im 21/36  brain]
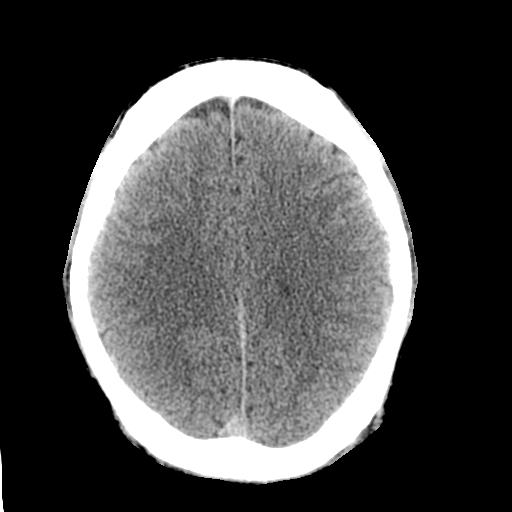
[im 23/36  brain]
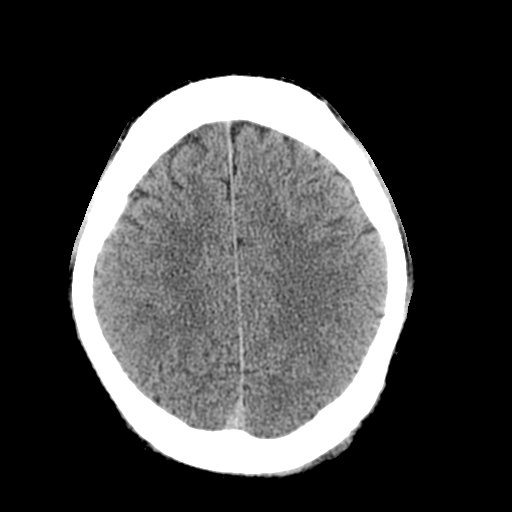
[im 26/36  brain]
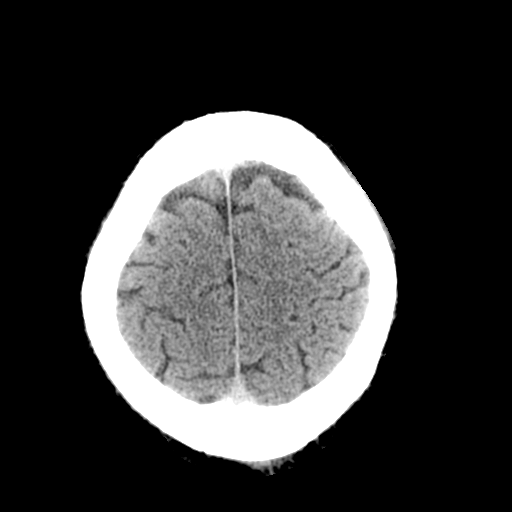
[im 27/36  brain]
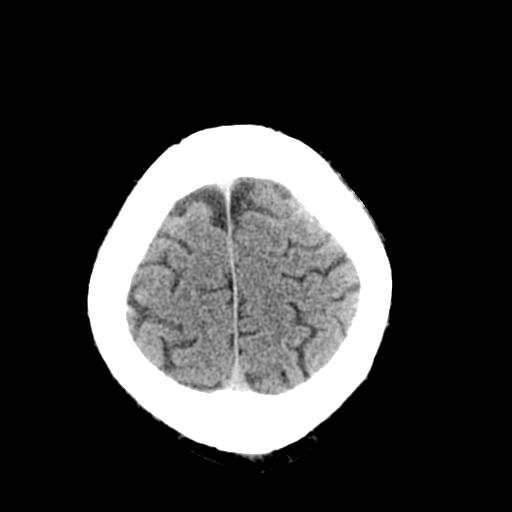
[im 27/36  bone]
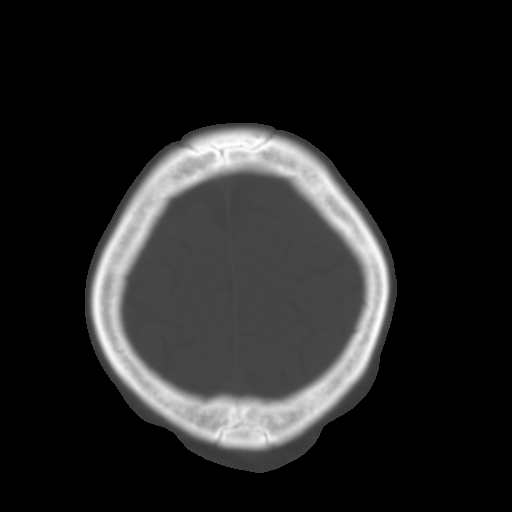
[im 29/36  brain]
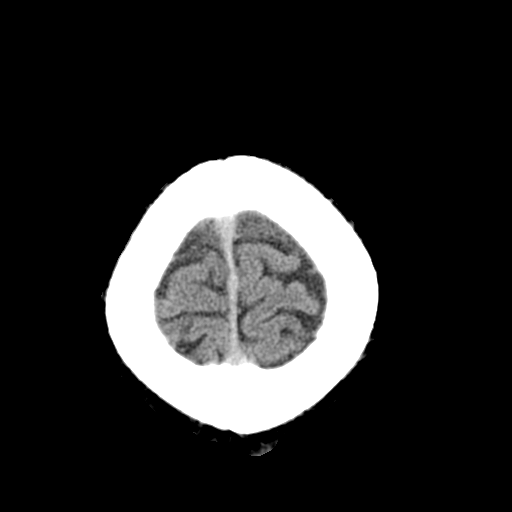
[im 32/36  brain]
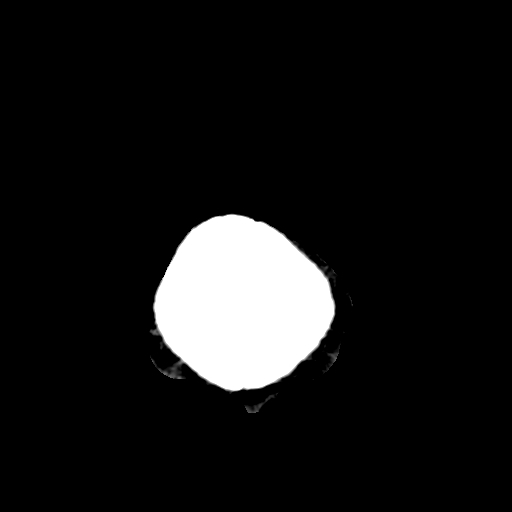
[im 34/36  brain]
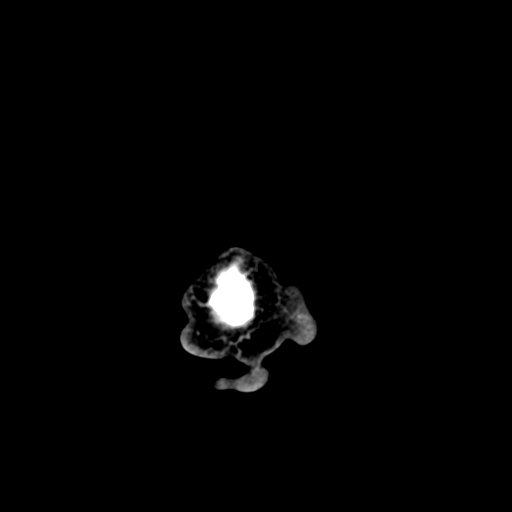

[16 of 30 positions shown; findings below may reference images not displayed]

FINDINGS: Visualized paranasal sinuses and mastoids are clear.
Visualized orbit soft tissues are within normal limits.  Scalp soft
tissues within normal limits. No acute osseous abnormality
identified.

Cerebral volume is within normal limits for age.  No midline shift,
ventriculomegaly, mass effect, evidence of mass lesion,
intracranial hemorrhage or evidence of cortically based acute
infarction.  Gray-white matter differentiation is within normal
limits throughout the brain.  No suspicious intracranial vascular
hyperdensity.
IMPRESSION: Normal noncontrast CT appearance of the brain.

## 2013-07-19 IMAGING — CR DG CHEST 2V
2 series · 2 of 2 positions shown · non-contrast
Comparison: Prior chest x-ray 12/13/2006

CLINICAL DATA: Chest pain

CHEST - 2 VIEW

[w chest pa]
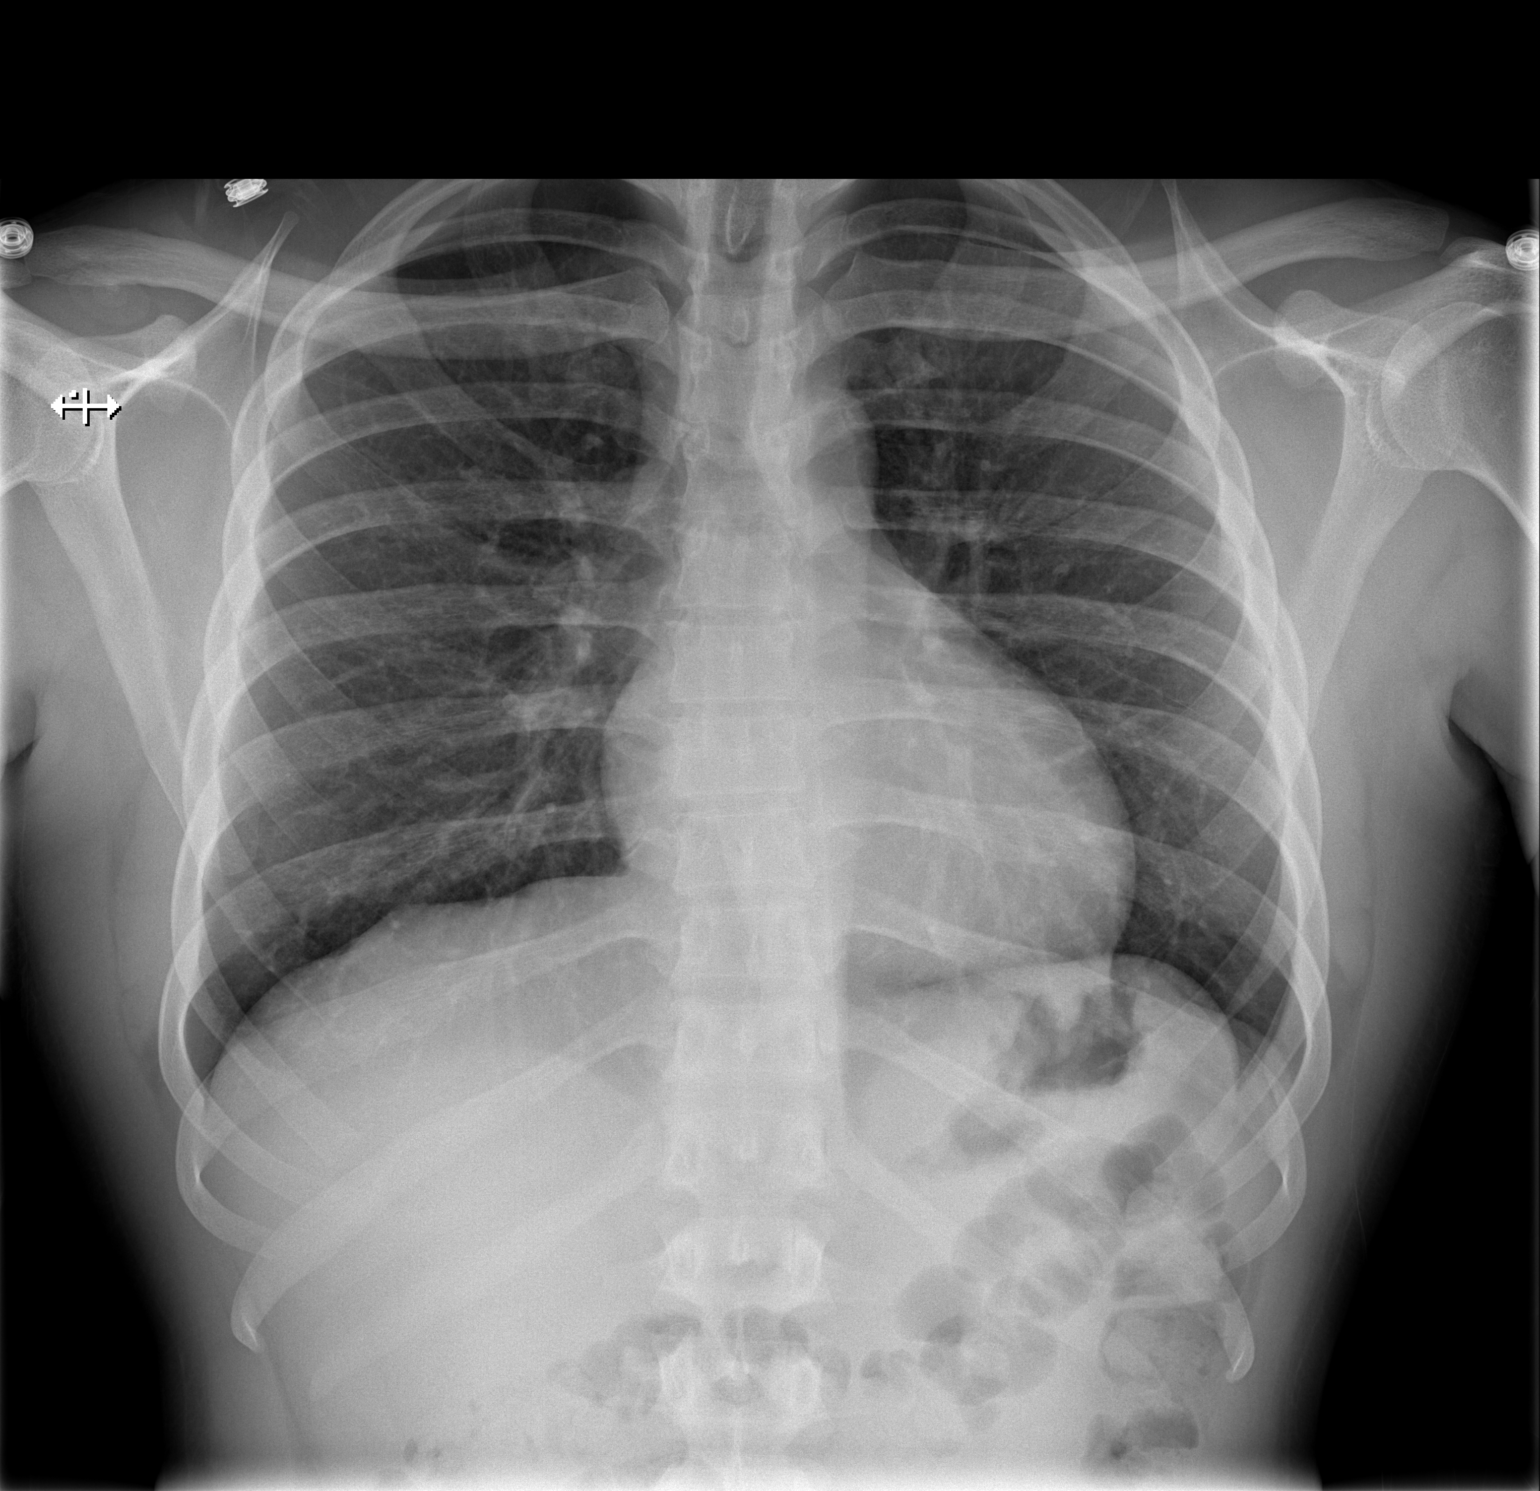

[w chest lat]
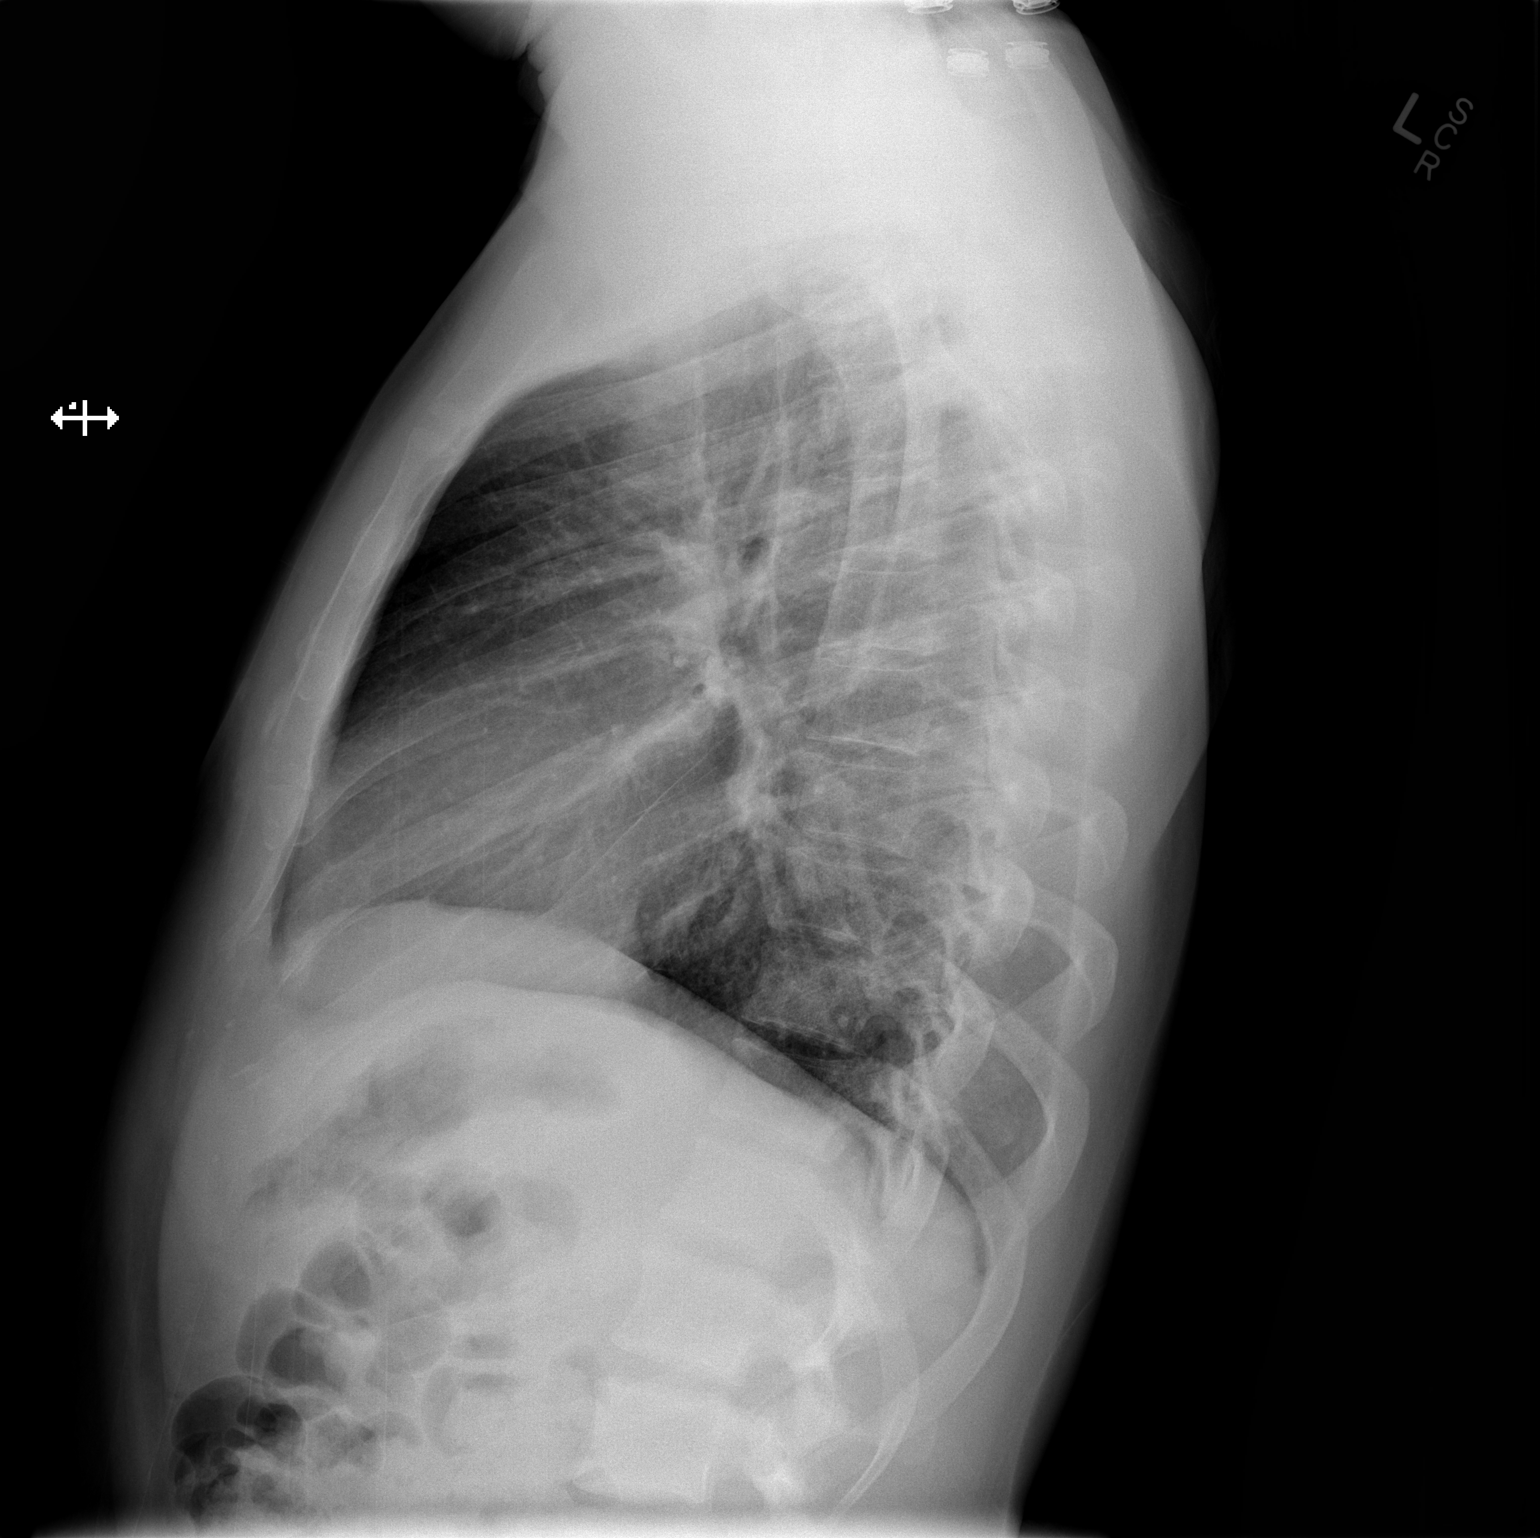

[2 of 2 positions shown; findings below may reference images not displayed]

FINDINGS: [The lungs are well-aerated and free from pulmonary
edema, focal airspace consolidation or pulmonary nodule.  Mild
bronchitic changes similar to prior.  Lungs and no longer
hyperinflated.  Cardiac and mediastinal contours are within normal
limits.  No pneumothorax, or pleural effusion. No acute osseous
findings.]
IMPRESSION: No acute cardiopulmonary disease.

## 2013-11-16 ENCOUNTER — Emergency Department (HOSPITAL_COMMUNITY)
Admission: EM | Admit: 2013-11-16 | Discharge: 2013-11-16 | Disposition: A | Discharge status: Home / Self Care | Payer: No Typology Code available for payment source | Attending: Emergency Medicine | Admitting: Emergency Medicine

## 2013-11-16 ENCOUNTER — Encounter (HOSPITAL_COMMUNITY): Payer: Self-pay | Admitting: Emergency Medicine

## 2013-11-16 DIAGNOSIS — IMO0002 Reserved for concepts with insufficient information to code with codable children: Secondary | ICD-10-CM | POA: Insufficient documentation

## 2013-11-16 DIAGNOSIS — H9209 Otalgia, unspecified ear: Secondary | ICD-10-CM | POA: Insufficient documentation

## 2013-11-16 DIAGNOSIS — J029 Acute pharyngitis, unspecified: Secondary | ICD-10-CM

## 2013-11-16 DIAGNOSIS — F17200 Nicotine dependence, unspecified, uncomplicated: Secondary | ICD-10-CM | POA: Insufficient documentation

## 2013-11-16 DIAGNOSIS — J45909 Unspecified asthma, uncomplicated: Secondary | ICD-10-CM | POA: Insufficient documentation

## 2013-11-16 DIAGNOSIS — J069 Acute upper respiratory infection, unspecified: Secondary | ICD-10-CM | POA: Insufficient documentation

## 2013-11-16 LAB — RAPID STREP SCREEN (MED CTR MEBANE ONLY): STREPTOCOCCUS, GROUP A SCREEN (DIRECT): NEGATIVE

## 2013-11-16 MED ORDER — FLUTICASONE PROPIONATE 50 MCG/ACT NA SUSP: 2.0000 | Freq: Every day | NASAL | Status: AC

## 2013-11-16 NOTE — ED Notes (Signed)
Sore throat, headache, seen by PA already.

## 2013-11-16 NOTE — ED Notes (Signed)
Patient c/o headache, sore throat, and fever x1 week. Patient reports bilateral ear ache but states "Now it's just left ear that hurts." Patient also states "Everything is on the left side-the headache, ear, and throat." Patient reports taking ibuprofen and sinus medication for fevers and headache. Per patient last ibuprofen at 3 this morning.

## 2013-11-16 NOTE — Discharge Instructions (Signed)
Use nasal spray as prescribed. Also use nasal saline and Mucinex. Rest and stay well-hydrated.  Pharyngitis Pharyngitis is redness, pain, and swelling (inflammation) of your pharynx.  CAUSES  Pharyngitis is usually caused by infection. Most of the time, these infections are from viruses (viral) and are part of a cold. However, sometimes pharyngitis is caused by bacteria (bacterial). Pharyngitis can also be caused by allergies. Viral pharyngitis may be spread from person to person by coughing, sneezing, and personal items or utensils (cups, forks, spoons, toothbrushes). Bacterial pharyngitis may be spread from person to person by more intimate contact, such as kissing.  SIGNS AND SYMPTOMS  Symptoms of pharyngitis include:   Sore throat.   Tiredness (fatigue).   Low-grade fever.   Headache.  Joint pain and muscle aches.  Skin rashes.  Swollen lymph nodes.  Plaque-like film on throat or tonsils (often seen with bacterial pharyngitis). DIAGNOSIS  Your health care provider will ask you questions about your illness and your symptoms. Your medical history, along with a physical exam, is often all that is needed to diagnose pharyngitis. Sometimes, a rapid strep test is done. Other lab tests may also be done, depending on the suspected cause.  TREATMENT  Viral pharyngitis will usually get better in 3 4 days without the use of medicine. Bacterial pharyngitis is treated with medicines that kill germs (antibiotics).  HOME CARE INSTRUCTIONS   Drink enough water and fluids to keep your urine clear or pale yellow.   Only take over-the-counter or prescription medicines as directed by your health care provider:   If you are prescribed antibiotics, make sure you finish them even if you start to feel better.   Do not take aspirin.   Get lots of rest.   Gargle with 8 oz of salt water ( tsp of salt per 1 qt of water) as often as every 1 2 hours to soothe your throat.   Throat lozenges  (if you are not at risk for choking) or sprays may be used to soothe your throat. SEEK MEDICAL CARE IF:   You have large, tender lumps in your neck.  You have a rash.  You cough up green, yellow-brown, or bloody spit. SEEK IMMEDIATE MEDICAL CARE IF:   Your neck becomes stiff.  You drool or are unable to swallow liquids.  You vomit or are unable to keep medicines or liquids down.  You have severe pain that does not go away with the use of recommended medicines.  You have trouble breathing (not caused by a stuffy nose). MAKE SURE YOU:   Understand these instructions.  Will watch your condition.  Will get help right away if you are not doing well or get worse. Document Released: 09/30/2005 Document Revised: 07/21/2013 Document Reviewed: 06/07/2013 [facility] Patient Information 2014 [place], [place].  Upper Respiratory Infection, Adult An upper respiratory infection (URI) is also sometimes known as the common cold. The upper respiratory tract includes the nose, sinuses, throat, trachea, and bronchi. Bronchi are the airways leading to the lungs. Most people improve within 1 week, but symptoms can last up to 2 weeks. A residual cough may last even longer.  CAUSES Many different viruses can infect the tissues lining the upper respiratory tract. The tissues become irritated and inflamed and often become very moist. Mucus production is also common. A cold is contagious. You can easily spread the virus to others by oral contact. This includes kissing, sharing a glass, coughing, or sneezing. Touching your mouth or nose and then touching  a surface, which is then touched by another person, can also spread the virus. SYMPTOMS  Symptoms typically develop 1 to 3 days after you come in contact with a cold virus. Symptoms vary from person to person. They may include:  Runny nose.  Sneezing.  Nasal congestion.  Sinus irritation.  Sore throat.  Loss of voice  (laryngitis).  Cough.  Fatigue.  Muscle aches.  Loss of appetite.  Headache.  Low-grade fever. DIAGNOSIS  You might diagnose your own cold based on familiar symptoms, since most people get a cold 2 to 3 times a year. Your caregiver can confirm this based on your exam. Most importantly, your caregiver can check that your symptoms are not due to another disease such as strep throat, sinusitis, pneumonia, asthma, or epiglottitis. Blood tests, throat tests, and X-rays are not necessary to diagnose a common cold, but they may sometimes be helpful in excluding other more serious diseases. Your caregiver will decide if any further tests are required. RISKS AND COMPLICATIONS  You may be at risk for a more severe case of the common cold if you smoke cigarettes, have chronic heart disease (such as heart failure) or lung disease (such as asthma), or if you have a weakened immune system. The very young and very old are also at risk for more serious infections. Bacterial sinusitis, middle ear infections, and bacterial pneumonia can complicate the common cold. The common cold can worsen asthma and chronic obstructive pulmonary disease (COPD). Sometimes, these complications can require emergency medical care and may be life-threatening. PREVENTION  The best way to protect against getting a cold is to practice good hygiene. Avoid oral or hand contact with people with cold symptoms. Wash your hands often if contact occurs. There is no clear evidence that vitamin C, vitamin E, echinacea, or exercise reduces the chance of developing a cold. However, it is always recommended to get plenty of rest and practice good nutrition. TREATMENT  Treatment is directed at relieving symptoms. There is no cure. Antibiotics are not effective, because the infection is caused by a virus, not by bacteria. Treatment may include:  Increased fluid intake. Sports drinks offer valuable electrolytes, sugars, and fluids.  Breathing  heated mist or steam (vaporizer or shower).  Eating chicken soup or other clear broths, and maintaining good nutrition.  Getting plenty of rest.  Using gargles or lozenges for comfort.  Controlling fevers with ibuprofen or acetaminophen as directed by your caregiver.  Increasing usage of your inhaler if you have asthma. Zinc gel and zinc lozenges, taken in the first 24 hours of the common cold, can shorten the duration and lessen the severity of symptoms. Pain medicines may help with fever, muscle aches, and throat pain. A variety of non-prescription medicines are available to treat congestion and runny nose. Your caregiver can make recommendations and may suggest nasal or lung inhalers for other symptoms.  HOME CARE INSTRUCTIONS   Only take over-the-counter or prescription medicines for pain, discomfort, or fever as directed by your caregiver.  Use a warm mist humidifier or inhale steam from a shower to increase air moisture. This may keep secretions moist and make it easier to breathe.  Drink enough water and fluids to keep your urine clear or pale yellow.  Rest as needed.  Return to work when your temperature has returned to normal or as your caregiver advises. You may need to stay home longer to avoid infecting others. You can also use a face mask and careful hand washing to  prevent spread of the virus. SEEK MEDICAL CARE IF:   After the first few days, you feel you are getting worse rather than better.  You need your caregiver's advice about medicines to control symptoms.  You develop chills, worsening shortness of breath, or brown or red sputum. These may be signs of pneumonia.  You develop yellow or brown nasal discharge or pain in the face, especially when you bend forward. These may be signs of sinusitis.  You develop a fever, swollen neck glands, pain with swallowing, or white areas in the back of your throat. These may be signs of strep throat. SEEK IMMEDIATE MEDICAL CARE  IF:   You have a fever.  You develop severe or persistent headache, ear pain, sinus pain, or chest pain.  You develop wheezing, a prolonged cough, cough up blood, or have a change in your usual mucus (if you have chronic lung disease).  You develop sore muscles or a stiff neck. Document Released: 03/26/2001 Document Revised: 12/23/2011 Document Reviewed: 02/01/2011 [facility] Patient Information 2014 [place], [place].

## 2013-11-16 NOTE — ED Provider Notes (Signed)
Medical screening examination/treatment/procedure(s) were performed by non-physician practitioner and as supervising physician I was immediately available for consultation/collaboration.  EKG Interpretation   None         Glynn OctaveStephen Scott Fix, MD 11/16/13 [phone]

## 2013-11-16 NOTE — ED Provider Notes (Signed)
CSN: [id number]     Arrival date & time 11/16/13  1339 History   First MD Initiated Contact with Patient 11/16/13 1407     Chief Complaint  Patient presents with  . Sore Throat  . Cough  . Headache  . Otalgia   (Consider location/radiation/quality/duration/timing/severity/associated sxs/prior Treatment) HPI Comments: Patient is a 38 year old male who presents to the emergency department complaining of sore throat, earache, headaches and subjective fevers times one week. Patient states initially both of his ears hurt, however now only his left hurts. States he looked in the mirror and noticed white spots in the back of his throat. He has had a slight dry cough. He has been taking ibuprofen and over-the-counter sinus medication with mild relief. Denies sick contacts. Denies nausea or vomiting.  Patient is a 38 y.o. male presenting with pharyngitis, cough, headaches, and ear pain. The history is provided by the patient.  Sore Throat Associated symptoms include congestion, coughing, headaches and a sore throat.  Cough Associated symptoms: ear pain, headaches and sore throat   Headache Associated symptoms: congestion, cough, ear pain, sinus pressure and sore throat   Otalgia Associated symptoms: congestion, cough, headaches and sore throat     Past Medical History  Diagnosis Date  . Asthma    History reviewed. No pertinent past surgical history. History reviewed. No pertinent family history. History  Substance Use Topics  . Smoking status: Current Every Day Smoker -- 0.25 packs/day    Types: Cigarettes  . Smokeless tobacco: Never Used  . Alcohol Use: No    Review of Systems  HENT: Positive for congestion, ear pain, sinus pressure and sore throat.   Respiratory: Positive for cough.   Neurological: Positive for headaches.  All other systems reviewed and are negative.    Allergies  Aspirin  Home Medications   Current Outpatient Rx  Name  Route  Sig  Dispense  Refill  .  ibuprofen (ADVIL,MOTRIN) 800 MG tablet   Oral   Take 800 mg by mouth every 8 (eight) hours as needed for pain.         Marland Kitchen. OVER THE COUNTER MEDICATION   Oral   Take 1 tablet by mouth daily as needed (otc sinus medication **for sinus pressure**).         Marland Kitchen. fluticasone (FLONASE) 50 MCG/ACT nasal spray   Each Nare   Place 2 sprays into both nostrils daily.   16 g   0    BP 136/93  Pulse 70  Temp(Src) 98.2 F (36.8 C)  Resp 16  Ht 5\' 11"  (1.803 m)  Wt 173 lb (78.472 kg)  BMI 24.14 kg/m2  SpO2 100% Physical Exam  Nursing note and vitals reviewed. Constitutional: He is oriented to person, place, and time. He appears well-developed and well-nourished. No distress.  HENT:  Head: Normocephalic and atraumatic.  Right Ear: Tympanic membrane and ear canal normal.  Left Ear: Tympanic membrane and ear canal normal.  Nose: Mucosal edema present.  Mouth/Throat: Uvula is midline.  Pharynx erythematous. Tonsils inflamed +1 bilateral with exudate, greater on left than right. No tonsillar abscess.  Eyes: Conjunctivae are normal.  Neck: Normal range of motion. Neck supple.  Cardiovascular: Normal rate, regular rhythm and normal heart sounds.   Pulmonary/Chest: Effort normal and breath sounds normal.  Musculoskeletal: Normal range of motion. He exhibits no edema.  Lymphadenopathy:       Head (right side): Tonsillar adenopathy present.       Head (left side): Tonsillar adenopathy present.  Neurological: He is alert and oriented to person, place, and time.  Skin: Skin is warm and dry. He is not diaphoretic.  Psychiatric: He has a normal mood and affect. His behavior is normal.    ED Course  Procedures (including critical care time) Labs Review Labs Reviewed  RAPID STREP SCREEN  CULTURE, GROUP A STREP   Imaging Review No results found.  EKG Interpretation   None       MDM   1. Pharyngitis   2. URI (upper respiratory infection)    Patient presenting with URI symptoms and  sore throat. He is well appearing, afebrile and in no apparent distress. Normal vital signs. Rapid strep negative. Discussed symptomatic treatment. Prescription for Flonase given. Stable for discharge. Return precautions given. Patient states understanding of treatment care plan and is agreeable.     Trevor Mace, PA-C 11/16/13 1537

## 2013-11-18 LAB — CULTURE, GROUP A STREP
